# Patient Record
Sex: Male | Born: 1995 | Race: Black or African American | Hispanic: No | Marital: Single | Smoking: Never smoker
Health system: Southern US, Community
[De-identification: ages and names within clinical notes are randomized; demographics above are authoritative.]

## PROBLEM LIST (undated history)

## (undated) DIAGNOSIS — F909 Attention-deficit hyperactivity disorder, unspecified type: Secondary | ICD-10-CM

---
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
## 2019-09-29 ENCOUNTER — Encounter (HOSPITAL_COMMUNITY): Payer: Self-pay | Admitting: *Deleted

## 2019-09-29 ENCOUNTER — Ambulatory Visit (HOSPITAL_COMMUNITY)
Admission: EM | Admit: 2019-09-29 | Discharge: 2019-09-29 | Disposition: A | Discharge status: Home / Self Care | Payer: Self-pay | Attending: Emergency Medicine | Admitting: Emergency Medicine

## 2019-09-29 DIAGNOSIS — S0181XA Laceration without foreign body of other part of head, initial encounter: Secondary | ICD-10-CM

## 2019-09-29 DIAGNOSIS — S01311A Laceration without foreign body of right ear, initial encounter: Secondary | ICD-10-CM

## 2019-09-29 MED ORDER — IBUPROFEN 800 MG PO TABS: 800.0000 mg | ORAL_TABLET | Freq: Three times a day (TID) | ORAL | 0 refills | Status: DC

## 2019-09-29 NOTE — Discharge Instructions (Addendum)
Use anti-inflammatories for pain/swelling. You may take up to 800 mg Ibuprofen every 8 hours with food. You may supplement Ibuprofen with Tylenol [phone] mg every 8 hours.  Ice face Keep wounds clean and dry Follow up if not improving or worsening
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
## 2020-07-15 ENCOUNTER — Other Ambulatory Visit (HOSPITAL_COMMUNITY)
Admission: RE | Admit: 2020-07-15 | Discharge: 2020-07-15 | Disposition: A | Discharge status: Home / Self Care | Payer: Medicaid Other | Source: Ambulatory Visit | Attending: Family | Admitting: Family

## 2020-07-15 ENCOUNTER — Telehealth (INDEPENDENT_AMBULATORY_CARE_PROVIDER_SITE_OTHER): Payer: Self-pay | Admitting: Family

## 2020-07-15 ENCOUNTER — Other Ambulatory Visit: Payer: Self-pay

## 2020-07-15 VITALS — BP 120/78 | HR 86 | Temp 98.1°F | Resp 15 | Ht 68.43 in | Wt 134.8 lb

## 2020-07-15 DIAGNOSIS — Z202 Contact with and (suspected) exposure to infections with a predominantly sexual mode of transmission: Secondary | ICD-10-CM | POA: Insufficient documentation

## 2020-07-15 DIAGNOSIS — Z7689 Persons encountering health services in other specified circumstances: Secondary | ICD-10-CM

## 2020-07-15 MED ORDER — DOXYCYCLINE HYCLATE 100 MG PO TABS: 100.0000 mg | ORAL_TABLET | Freq: Two times a day (BID) | ORAL | 0 refills | Status: AC
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
## 2020-07-16 LAB — URINE CYTOLOGY ANCILLARY ONLY
Bacterial Vaginitis-Urine: NEGATIVE
Candida Urine: NEGATIVE
Chlamydia: NEGATIVE
Comment: NEGATIVE
Comment: NEGATIVE
Comment: NORMAL
Neisseria Gonorrhea: NEGATIVE
Trichomonas: NEGATIVE

## 2020-07-16 NOTE — Progress Notes (Signed)
Negative trichomonas, chlamydia, gonorrhea, and candida.

## 2020-08-19 NOTE — Progress Notes (Signed)
Patient ID: Raymond Garza, male    DOB: 1995-10-11  MRN: [id number]  CC: Annual Physical Exam   Subjective: Raymond Garza is a 25 y.o. male who presents for annual physical exam.   His concerns today include: none.  Patient Active Problem List   Diagnosis Date Noted   ODD (oppositional defiant disorder) 05/14/2013     No current outpatient medications on file prior to visit.   No current facility-administered medications on file prior to visit.    No Known Allergies  Social History   Socioeconomic History   Marital status: Single    Spouse name: Not on file   Number of children: Not on file   Years of education: Not on file   Highest education level: Not on file  Occupational History   Not on file  Tobacco Use   Smoking status: Never   Smokeless tobacco: Never  Vaping Use   Vaping Use: Every day  Substance and Sexual Activity   Alcohol use: No   Drug use: No   Sexual activity: Not on file  Other Topics Concern   Not on file  Social History Narrative   Not on file   Social Determinants of Health   Financial Resource Strain: Not on file  Food Insecurity: Not on file  Transportation Needs: Not on file  Physical Activity: Not on file  Stress: Not on file  Social Connections: Not on file  Intimate Partner Violence: Not on file    History reviewed. No pertinent family history.  History reviewed. No pertinent surgical history.  ROS: Review of Systems Negative except as stated above  PHYSICAL EXAM: BP 109/69 (BP Location: Left Arm, Patient Position: Sitting, Cuff Size: Normal)   Pulse 88   Temp 97.9 F (36.6 C)   Resp 15   Ht 5' 8.94" (1.751 m)   Wt 139 lb 9.6 oz (63.3 kg)   SpO2 98%   BMI 20.65 kg/m   Physical Exam Constitutional:      Appearance: He is normal weight.  HENT:     Head: Normocephalic and atraumatic.     Right Ear: Tympanic membrane, ear canal and external ear normal.     Left Ear: Tympanic membrane, ear canal and  external ear normal.  Eyes:     Extraocular Movements: Extraocular movements intact.     Conjunctiva/sclera: Conjunctivae normal.     Pupils: Pupils are equal, round, and reactive to light.  Cardiovascular:     Rate and Rhythm: Normal rate and regular rhythm.     Pulses: Normal pulses.     Heart sounds: Normal heart sounds.  Pulmonary:     Effort: Pulmonary effort is normal.     Breath sounds: Normal breath sounds.  Abdominal:     General: Abdomen is flat. Bowel sounds are normal.     Palpations: Abdomen is soft.  Genitourinary:    Comments: Patient declined exam. Musculoskeletal:        General: Normal range of motion.     Cervical back: Normal range of motion and neck supple.  Skin:    General: Skin is warm and dry.     Capillary Refill: Capillary refill takes less than 2 seconds.  Neurological:     General: No focal deficit present.     Mental Status: He is alert and oriented to person, place, and time.  Psychiatric:        Mood and Affect: Mood normal.        Behavior:  Behavior normal.   ASSESSMENT AND PLAN: 1. Annual physical exam: - Counseled on 150 minutes of exercise per week as tolerated, healthy eating (including decreased daily intake of saturated fats, cholesterol, added sugars, sodium), STI prevention, and routine healthcare maintenance.  2. Screening for metabolic disorder: - ZGQ36+IXMD to check kidney function, liver function, and electrolyte balance.  - CMP14+EGFR  3. Screening for deficiency anemia: - CBC to screen for anemia. - CBC  4. Diabetes mellitus screening: - Hemoglobin A1c to screen for pre-diabetes/diabetes. - Hemoglobin A1c  5. Screening cholesterol level: - Lipid panel to screen for high cholesterol.  - Lipid panel  6. Thyroid disorder screen: - TSH to check thyroid function.  - TSH  7. Need for hepatitis C screening test: - Hepatitis C antibody to screen for hepatitis C.  - Hepatitis C Antibody  8. Encounter for screening for  HIV: - HIV antibody to screen for human immunodeficiency virus.  - HIV antibody (with reflex)  9. Screening for STDs (sexually transmitted diseases): - Urine cytology to screen for gonorrhea, chlamydia, and trichomonas.  - Urine cytology ancillary only   Patient was given the opportunity to ask questions.  Patient verbalized understanding of the plan and was able to repeat key elements of the plan. Patient was given clear instructions to go to Emergency Department or return to medical center if symptoms don't improve, worsen, or new problems develop.The patient verbalized understanding.   Orders Placed This Encounter  Procedures   HIV antibody (with reflex)   Hepatitis C Antibody   CBC   Lipid panel   TSH   CMP14+EGFR   Hemoglobin A1c   Follow-up with primary provider as scheduled.   Camillia Herter, NP

## 2020-08-20 ENCOUNTER — Encounter: Payer: Self-pay | Admitting: Family

## 2020-08-20 ENCOUNTER — Other Ambulatory Visit (HOSPITAL_COMMUNITY)
Admission: RE | Admit: 2020-08-20 | Discharge: 2020-08-20 | Disposition: A | Discharge status: Home / Self Care | Payer: Self-pay | Source: Ambulatory Visit | Attending: Family | Admitting: Family

## 2020-08-20 ENCOUNTER — Other Ambulatory Visit: Payer: Self-pay

## 2020-08-20 ENCOUNTER — Ambulatory Visit (INDEPENDENT_AMBULATORY_CARE_PROVIDER_SITE_OTHER): Payer: Self-pay | Admitting: Family

## 2020-08-20 VITALS — BP 109/69 | HR 88 | Temp 97.9°F | Resp 15 | Ht 68.94 in | Wt 139.6 lb

## 2020-08-20 DIAGNOSIS — Z131 Encounter for screening for diabetes mellitus: Secondary | ICD-10-CM

## 2020-08-20 DIAGNOSIS — Z114 Encounter for screening for human immunodeficiency virus [HIV]: Secondary | ICD-10-CM

## 2020-08-20 DIAGNOSIS — Z1329 Encounter for screening for other suspected endocrine disorder: Secondary | ICD-10-CM

## 2020-08-20 DIAGNOSIS — Z113 Encounter for screening for infections with a predominantly sexual mode of transmission: Secondary | ICD-10-CM

## 2020-08-20 DIAGNOSIS — Z13228 Encounter for screening for other metabolic disorders: Secondary | ICD-10-CM

## 2020-08-20 DIAGNOSIS — Z13220 Encounter for screening for lipoid disorders: Secondary | ICD-10-CM

## 2020-08-20 DIAGNOSIS — Z130 Encounter for screening for diseases of the blood and blood-forming organs and certain disorders involving the immune mechanism: Secondary | ICD-10-CM

## 2020-08-20 DIAGNOSIS — Z1159 Encounter for screening for other viral diseases: Secondary | ICD-10-CM

## 2020-08-20 DIAGNOSIS — Z0000 Encounter for general adult medical examination without abnormal findings: Secondary | ICD-10-CM

## 2020-08-20 NOTE — Progress Notes (Signed)
Pt presents for annual physical exam desires STD screening no other concerns

## 2020-08-21 LAB — CMP14+EGFR
ALT: 12 IU/L (ref 0–44)
AST: 20 IU/L (ref 0–40)
Albumin/Globulin Ratio: 2 (ref 1.2–2.2)
Albumin: 4.9 g/dL (ref 4.1–5.2)
Alkaline Phosphatase: 66 IU/L (ref 44–121)
BUN/Creatinine Ratio: 16 (ref 9–20)
BUN: 13 mg/dL (ref 6–20)
Bilirubin Total: 0.5 mg/dL (ref 0.0–1.2)
CO2: 22 mmol/L (ref 20–29)
Calcium: 9.8 mg/dL (ref 8.7–10.2)
Chloride: 103 mmol/L (ref 96–106)
Creatinine, Ser: 0.83 mg/dL (ref 0.76–1.27)
Globulin, Total: 2.4 g/dL (ref 1.5–4.5)
Glucose: 79 mg/dL (ref 65–99)
Potassium: 4.4 mmol/L (ref 3.5–5.2)
Sodium: 139 mmol/L (ref 134–144)
Total Protein: 7.3 g/dL (ref 6.0–8.5)
eGFR: 127 mL/min/{1.73_m2} (ref >59)

## 2020-08-21 LAB — CBC
Hematocrit: 38.5 % (ref 37.5–51.0)
Hemoglobin: 13 g/dL (ref 13.0–17.7)
MCH: 29 pg (ref 26.6–33.0)
MCHC: 33.8 g/dL (ref 31.5–35.7)
MCV: 86 fL (ref 79–97)
Platelets: 272 10*3/uL (ref 150–450)
RBC: 4.49 x10E6/uL (ref 4.14–5.80)
RDW: 12.5 % (ref 11.6–15.4)
WBC: 4.3 10*3/uL (ref 3.4–10.8)

## 2020-08-21 LAB — TSH: TSH: 1.1 u[IU]/mL (ref 0.450–4.500)

## 2020-08-21 LAB — HIV ANTIBODY (ROUTINE TESTING W REFLEX): HIV Screen 4th Generation wRfx: NONREACTIVE

## 2020-08-21 LAB — LIPID PANEL
Chol/HDL Ratio: 2.2 ratio (ref 0.0–5.0)
Cholesterol, Total: 149 mg/dL (ref 100–199)
HDL: 67 mg/dL (ref >39)
LDL Chol Calc (NIH): 73 mg/dL (ref 0–99)
Triglycerides: 38 mg/dL (ref 0–149)
VLDL Cholesterol Cal: 9 mg/dL (ref 5–40)

## 2020-08-21 LAB — HEMOGLOBIN A1C
Est. average glucose Bld gHb Est-mCnc: 103 mg/dL
Hgb A1c MFr Bld: 5.2 % (ref 4.8–5.6)

## 2020-08-21 LAB — HEPATITIS C ANTIBODY: Hep C Virus Ab: <0.1 s/co ratio (ref 0.0–0.9)

## 2020-08-21 NOTE — Progress Notes (Signed)
Kidney function normal.   Liver function normal.   Thyroid function normal.   Cholesterol normal.   No diabetes.   No anemia.   Hepatitis C negative.   HIV negative.

## 2020-08-22 LAB — URINE CYTOLOGY ANCILLARY ONLY
Bacterial Vaginitis-Urine: NEGATIVE
Candida Urine: NEGATIVE
Chlamydia: NEGATIVE
Comment: NEGATIVE
Comment: NEGATIVE
Comment: NORMAL
Neisseria Gonorrhea: NEGATIVE
Trichomonas: NEGATIVE

## 2020-08-22 NOTE — Progress Notes (Signed)
Gonorrhea, Chlamydia, Trichomonas, Yeast negative.

## 2024-03-06 ENCOUNTER — Emergency Department (HOSPITAL_COMMUNITY)
Admission: EM | Admit: 2024-03-06 | Discharge: 2024-03-07 | Attending: Emergency Medicine | Admitting: Emergency Medicine

## 2024-03-06 ENCOUNTER — Emergency Department (HOSPITAL_COMMUNITY)

## 2024-03-06 ENCOUNTER — Other Ambulatory Visit: Payer: Self-pay

## 2024-03-06 DIAGNOSIS — R109 Unspecified abdominal pain: Secondary | ICD-10-CM

## 2024-03-06 DIAGNOSIS — D72829 Elevated white blood cell count, unspecified: Secondary | ICD-10-CM | POA: Insufficient documentation

## 2024-03-06 DIAGNOSIS — K5900 Constipation, unspecified: Secondary | ICD-10-CM | POA: Diagnosis not present

## 2024-03-06 DIAGNOSIS — R300 Dysuria: Secondary | ICD-10-CM | POA: Insufficient documentation

## 2024-03-06 DIAGNOSIS — R112 Nausea with vomiting, unspecified: Secondary | ICD-10-CM | POA: Diagnosis not present

## 2024-03-06 DIAGNOSIS — R1031 Right lower quadrant pain: Secondary | ICD-10-CM | POA: Diagnosis present

## 2024-03-06 LAB — COMPREHENSIVE METABOLIC PANEL WITH GFR
ALT: 11 U/L (ref 0–44)
AST: 25 U/L (ref 15–41)
Albumin: 4.6 g/dL (ref 3.5–5.0)
Alkaline Phosphatase: 77 U/L (ref 38–126)
Anion gap: 12 (ref 5–15)
BUN: 12 mg/dL (ref 6–20)
CO2: 24 mmol/L (ref 22–32)
Calcium: 9.6 mg/dL (ref 8.9–10.3)
Chloride: 102 mmol/L (ref 98–111)
Creatinine, Ser: 0.89 mg/dL (ref 0.61–1.24)
GFR, Estimated: >60 mL/min
Glucose, Bld: 125 mg/dL — ABNORMAL HIGH (ref 70–99)
Potassium: 4 mmol/L (ref 3.5–5.1)
Sodium: 138 mmol/L (ref 135–145)
Total Bilirubin: 0.2 mg/dL (ref 0.0–1.2)
Total Protein: 7.6 g/dL (ref 6.5–8.1)

## 2024-03-06 LAB — CBC
HCT: 37.8 % — ABNORMAL LOW (ref 39.0–52.0)
Hemoglobin: 12.6 g/dL — ABNORMAL LOW (ref 13.0–17.0)
MCH: 28.6 pg (ref 26.0–34.0)
MCHC: 33.3 g/dL (ref 30.0–36.0)
MCV: 85.9 fL (ref 80.0–100.0)
Platelets: 286 10*3/uL (ref 150–400)
RBC: 4.4 MIL/uL (ref 4.22–5.81)
RDW: 12.7 % (ref 11.5–15.5)
WBC: 17.2 10*3/uL — ABNORMAL HIGH (ref 4.0–10.5)
nRBC: 0 % (ref 0.0–0.2)

## 2024-03-06 LAB — LIPASE, BLOOD: Lipase: 23 U/L (ref 11–51)

## 2024-03-06 MED ORDER — ONDANSETRON HCL 4 MG/2ML IJ SOLN
4.0000 mg | Freq: Once | INTRAMUSCULAR | Status: AC
  Administered 2024-03-06: 4 mg via INTRAVENOUS
  Filled 2024-03-06: qty 2

## 2024-03-06 MED ORDER — IOHEXOL 300 MG/ML  SOLN
100.0000 mL | Freq: Once | INTRAMUSCULAR | Status: AC | PRN
  Administered 2024-03-06: 100 mL via INTRAVENOUS

## 2024-03-06 NOTE — ED Provider Notes (Signed)
 " Raymond Garza Provider Note   CSN: [id number] Arrival date & time: 03/06/24  2227     Patient presents with: Abdominal Cramping and Abdominal Pain   Raymond Garza is a 29 y.o. male.  [facility] today from [facility] for chills and right lower quadrant abdominal pain with cramping x 1 hour.  Patient also endorses nausea, 1 episode of emesis, and difficulty urinating.  Patient denies constipation, diarrhea, other urinary symptoms, chest pain, shortness of breath, penile discharge, any other complaints at this time.  {Add pertinent medical, surgical, social history, OB history to HPI:32947}  Abdominal Cramping Associated symptoms include abdominal pain.  Abdominal Pain Associated symptoms: chills and nausea        Prior to Admission medications  Not on File    Allergies: Patient has no known allergies.    Review of Systems  Constitutional:  Positive for chills.  Gastrointestinal:  Positive for abdominal pain and nausea.    Updated Vital Signs BP 137/80   Pulse 65   Temp 97.6 F (36.4 C)   Resp 16   SpO2 100%   Physical Exam Vitals and nursing note reviewed.  Constitutional:      General: He is not in acute distress.    Appearance: He is well-developed. He is not toxic-appearing.  HENT:     Head: Normocephalic and atraumatic.  Eyes:     Conjunctiva/sclera: Conjunctivae normal.  Cardiovascular:     Rate and Rhythm: Normal rate and regular rhythm.     Heart sounds: No murmur heard. Pulmonary:     Effort: Pulmonary effort is normal. No respiratory distress.     Breath sounds: Normal breath sounds.  Abdominal:     General: There is no distension.     Palpations: Abdomen is soft.     Tenderness: There is abdominal tenderness in the right lower quadrant.  Musculoskeletal:        General: No swelling.     Cervical back: Neck supple.  Skin:    General: Skin is warm and dry.     Capillary Refill: Capillary refill takes less  than 2 seconds.  Neurological:     Mental Status: He is alert.  Psychiatric:        Mood and Affect: Mood normal.     (all labs ordered are listed, but only abnormal results are displayed) Labs Reviewed  LIPASE, BLOOD  COMPREHENSIVE METABOLIC PANEL WITH GFR  CBC  URINALYSIS, ROUTINE W REFLEX MICROSCOPIC    EKG: None  Radiology: No results found.  {Document cardiac monitor, telemetry assessment procedure when appropriate:32947} Procedures   Medications Ordered in the ED - No data to display    {Click here for ABCD2, HEART and other calculators REFRESH Note before signing:1}                              Medical Decision Making  This patient presents to the ED for concern of right lower quadrant abdominal pain with nausea differential diagnosis includes appendicitis, pancreatitis, choledocholithiasis, acute cholecystitis, SBO, viral GI illness, volvulus, Crohn's disease, diverticulitis    Additional history obtained   Additional history obtained from Electronic Medical Record External records from outside source obtained and reviewed including family medicine notes   Lab Tests:  I Ordered, and personally interpreted labs.  The pertinent results include: Leukocytosis at 17.2, CMP unremarkable, lipase 23   Imaging Studies ordered:  I ordered imaging studies  including CT abdomen pelvis with contrast I independently visualized and interpreted imaging which showed *** I agree with the radiologist interpretation   Medicines ordered and prescription drug management:  I ordered medication including Zofran     I have reviewed the patients home medicines and have made adjustments as needed   Problem List / ED Course:  ***  {Document critical care time when appropriate  Document review of labs and clinical decision tools ie CHADS2VASC2, etc  Document your independent review of radiology images and any outside records  Document your discussion with family members,  caretakers and with consultants  Document social determinants of health affecting pt's care  Document your decision making why or why not admission, treatments were needed:32947:::1}   Final diagnoses:  None    ED Discharge Orders     None        "

## 2024-03-06 NOTE — ED Notes (Signed)
 Patient transported to CT
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
## 2024-03-07 LAB — URINALYSIS, ROUTINE W REFLEX MICROSCOPIC
Bilirubin Urine: NEGATIVE
Glucose, UA: NEGATIVE mg/dL
Hgb urine dipstick: NEGATIVE
Ketones, ur: NEGATIVE mg/dL
Leukocytes,Ua: NEGATIVE
Nitrite: NEGATIVE
Protein, ur: NEGATIVE mg/dL
Specific Gravity, Urine: 1.024 (ref 1.005–1.030)
pH: 5 (ref 5.0–8.0)

## 2024-03-07 MED ORDER — DICYCLOMINE HCL 20 MG PO TABS: 20.0000 mg | ORAL_TABLET | Freq: Two times a day (BID) | ORAL | 0 refills | Status: AC | PRN

## 2024-03-07 MED ORDER — DICYCLOMINE HCL 20 MG PO TABS
20.0000 mg | ORAL_TABLET | Freq: Once | ORAL | Status: AC
  Administered 2024-03-07: 20 mg via ORAL
  Filled 2024-03-07: qty 1

## 2024-03-07 MED ORDER — ONDANSETRON 4 MG PO TBDP: 4.0000 mg | ORAL_TABLET | Freq: Three times a day (TID) | ORAL | 0 refills | Status: AC | PRN

## 2024-03-07 MED ORDER — POLYETHYLENE GLYCOL 3350 17 GM/SCOOP PO POWD: 17.0000 g | Freq: Every day | ORAL | 0 refills | Status: AC | PRN

## 2024-03-07 MED ORDER — POLYETHYLENE GLYCOL 3350 17 G PO PACK
17.0000 g | PACK | Freq: Once | ORAL | Status: AC
  Administered 2024-03-07: 17 g via ORAL
  Filled 2024-03-07: qty 1

## 2024-03-07 NOTE — Discharge Instructions (Signed)
 Today you were seen for abdominal pain with nausea and vomiting.  Your workup in the emergency department was reassuring, but did show that you are mildly constipated.  You have been prescribed Zofran  for nausea and vomiting, Bentyl  for abdominal pain, and MiraLAX  for constipation.  Please return to the ED if you have uncontrollable vomiting, severe abdominal pain, or fever that does not go down with Tylenol  or Motrin .  Thank you for letting us  treat you today. After reviewing your labs and imaging, I feel you are safe to go home. Please follow up with your PCP in the next several days and provide them with your records from this visit. Return to the Emergency Room if pain becomes severe or symptoms worsen.
